# Patient Record
Sex: Male | Born: 2000 | Race: Black or African American | Hispanic: No | Marital: Single | Smoking: Never smoker
Health system: Southern US, Community
[De-identification: ages and names within clinical notes are randomized; demographics above are authoritative.]

## PROBLEM LIST (undated history)

## (undated) DIAGNOSIS — D573 Sickle-cell trait: Secondary | ICD-10-CM

---
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
## 2020-11-09 ENCOUNTER — Other Ambulatory Visit: Payer: Self-pay

## 2020-11-09 ENCOUNTER — Ambulatory Visit (INDEPENDENT_AMBULATORY_CARE_PROVIDER_SITE_OTHER): Payer: Self-pay

## 2020-11-09 ENCOUNTER — Ambulatory Visit (HOSPITAL_COMMUNITY)
Admission: EM | Admit: 2020-11-09 | Discharge: 2020-11-09 | Disposition: A | Discharge status: Home / Self Care | Payer: Self-pay | Attending: Medical Oncology | Admitting: Medical Oncology

## 2020-11-09 ENCOUNTER — Encounter (HOSPITAL_COMMUNITY): Payer: Self-pay | Admitting: Emergency Medicine

## 2020-11-09 DIAGNOSIS — R0602 Shortness of breath: Secondary | ICD-10-CM

## 2020-11-09 DIAGNOSIS — J4521 Mild intermittent asthma with (acute) exacerbation: Secondary | ICD-10-CM

## 2020-11-09 DIAGNOSIS — R042 Hemoptysis: Secondary | ICD-10-CM

## 2020-11-09 MED ORDER — METHYLPREDNISOLONE SODIUM SUCC 125 MG IJ SOLR
125.0000 mg | Freq: Once | INTRAMUSCULAR | Status: AC
  Administered 2020-11-09: 125 mg via INTRAMUSCULAR

## 2020-11-09 MED ORDER — PREDNISONE 10 MG (21) PO TBPK: ORAL_TABLET | Freq: Every day | ORAL | 0 refills | Status: AC

## 2020-11-09 MED ORDER — METHYLPREDNISOLONE SODIUM SUCC 125 MG IJ SOLR
INTRAMUSCULAR | Status: AC
  Filled 2020-11-09: qty 2

## 2020-11-09 MED ORDER — BENZONATATE 100 MG PO CAPS: 100.0000 mg | ORAL_CAPSULE | Freq: Three times a day (TID) | ORAL | 0 refills | Status: AC

## 2020-11-09 MED ORDER — ALBUTEROL SULFATE HFA 108 (90 BASE) MCG/ACT IN AERS
2.0000 | INHALATION_SPRAY | Freq: Once | RESPIRATORY_TRACT | Status: AC
  Administered 2020-11-09: 2 via RESPIRATORY_TRACT

## 2020-11-09 MED ORDER — FLUTICASONE PROPIONATE 50 MCG/ACT NA SUSP: 2.0000 | Freq: Every day | NASAL | 0 refills | Status: AC

## 2020-11-09 MED ORDER — ALBUTEROL SULFATE HFA 108 (90 BASE) MCG/ACT IN AERS
INHALATION_SPRAY | RESPIRATORY_TRACT | Status: AC
  Filled 2020-11-09: qty 6.7

## 2020-11-09 NOTE — ED Triage Notes (Signed)
Cough for a week, , runny nose, muscle aches, stuffy nose, and soreness around lower ribs.

## 2020-11-09 NOTE — ED Notes (Signed)
Patient was evaluated by sarah, pa. prior to leaving the building

## 2020-11-09 NOTE — ED Provider Notes (Signed)
[facility]    CSN: [id number] Arrival date & time: 11/09/20  1315      History   Chief Complaint Chief Complaint  Patient presents with   Cough    HPI Nathan Clements is a 20 y.o. male.   HPI  Cough: Patient reports that he has had a cough that is mostly dry but he does have episodes of yellow and red sputum along with nasal congestion, fatigue, shortness of breath and pleuritic chest pain in the back.  He has been having cold sweats and chills but notes no known fevers though suspected.  He has not tried anything for symptoms as he does not have an inhaler at home.   Past Medical History:  Diagnosis Date   Asthma    Sickle cell trait (HCC)     There are no problems to display for this patient.   History reviewed. No pertinent surgical history.   Home Medications    Prior to Admission medications   Medication Sig Start Date End Date Taking? Authorizing Provider  albuterol (PROVENTIL HFA;VENTOLIN HFA) 108 (90 BASE) MCG/ACT inhaler Inhale 1-2 puffs into the lungs every 6 (six) hours as needed for wheezing. Patient not taking: Reported on 11/09/2020 03/06/12   Marlon Pel, PA-C  aspirin 325 MG tablet Take 325 mg by mouth daily. Pain Patient not taking: Reported on 11/09/2020    [provider]    Family History Family History  Problem Relation Age of Onset   Hypertension Mother     Social History Social History   Tobacco Use   Smoking status: Never   Smokeless tobacco: Never  Vaping Use   Vaping Use: Never used  Substance Use Topics   Alcohol use: Not Currently    Comment: occ   Drug use: Yes    Types: Marijuana     Allergies   Penicillins   Review of Systems Review of Systems  As stated above in HPI Physical Exam Triage Vital Signs ED Triage Vitals  Enc Vitals Group     BP 11/09/20 1446 131/81     Pulse Rate 11/09/20 1446 92     Resp 11/09/20 1446 20     Temp 11/09/20 1446 98.7 F (37.1 C)     Temp Source  11/09/20 1446 Oral     SpO2 11/09/20 1446 95 %     Weight --      Height --      Head Circumference --      Peak Flow --      Pain Score 11/09/20 1443 8     Pain Loc --      Pain Edu? --      Excl. in GC? --    No data found.  Updated Vital Signs BP 131/81 (BP Location: Left Arm)   Pulse 92   Temp 98.7 F (37.1 C) (Oral)   Resp 20   SpO2 95%   Physical Exam Vitals and nursing note reviewed.  Constitutional:      General: He is not in acute distress.    Appearance: Normal appearance. He is not ill-appearing, toxic-appearing or diaphoretic.  HENT:     Head: Normocephalic and atraumatic.     Right Ear: Tympanic membrane normal.     Left Ear: Tympanic membrane normal.     Nose: Congestion and rhinorrhea present.     Mouth/Throat:     Mouth: Mucous membranes are moist.     Pharynx: No oropharyngeal exudate or posterior  oropharyngeal erythema.  Eyes:     Extraocular Movements: Extraocular movements intact.     Pupils: Pupils are equal, round, and reactive to light.  Cardiovascular:     Rate and Rhythm: Normal rate and regular rhythm.     Heart sounds: Normal heart sounds.  Pulmonary:     Breath sounds: Wheezing present.  Musculoskeletal:     Cervical back: Normal range of motion and neck supple.  Lymphadenopathy:     Cervical: Cervical adenopathy present.  Skin:    General: Skin is warm.  Neurological:     Mental Status: He is alert and oriented to person, place, and time.     UC Treatments / Results  Labs (all labs ordered are listed, but only abnormal results are displayed) Labs Reviewed - No data to display  EKG   Radiology No results found.  Procedures Procedures (including critical care time)  Medications Ordered in UC Medications - No data to display  Initial Impression / Assessment and Plan / UC Course  I have reviewed the triage vital signs and the nursing notes.  Pertinent labs & imaging results that were available during my care of the  patient were reviewed by me and considered in my medical decision making (see chart for details).     New. Appears to be an asthma exacerbation. Treating as such while we obtain a chest x ray given his hemoptysis with posterior lung pain to ensure no sign of pneumonia. Treating with prednisone, albuterol, tessalon, flonase. Discussed red flag signs and symptoms. Follow up PRN.  Final Clinical Impressions(s) / UC Diagnoses   Final diagnoses:  None   Discharge Instructions   None    ED Prescriptions   None    PDMP not reviewed this encounter.   Hughie Closs, [place] 11/09/20 1606

## 2020-11-09 NOTE — ED Notes (Signed)
When initially discharged, patient was ambulating to door, stopped and put his head down on desk saying he did not feel well.  Instructed and assisted patient to sit on floor.  Wheelchair was obtained and patient was assisted by this nurse and peer to the wheelchair.  Placed in treatment room flat on exam table.  Rechecked vital signs/documented.  Provided ice packs to cool down and provided cola.  Patient has not eaten today.  After cola, lying flat, patient gradually advanced position and activity level.  Patient has been escorted to the car by wheelchair .

## 2022-06-16 IMAGING — DX DG CHEST 2V
2 series · 2 of 2 positions shown · non-contrast
Comparison: 03/06/2012

CLINICAL DATA: Hemoptysis. Shortness of breath. Asthma. Sickle cell
disease.

EXAM:
CHEST - 2 VIEW

[chest pa]
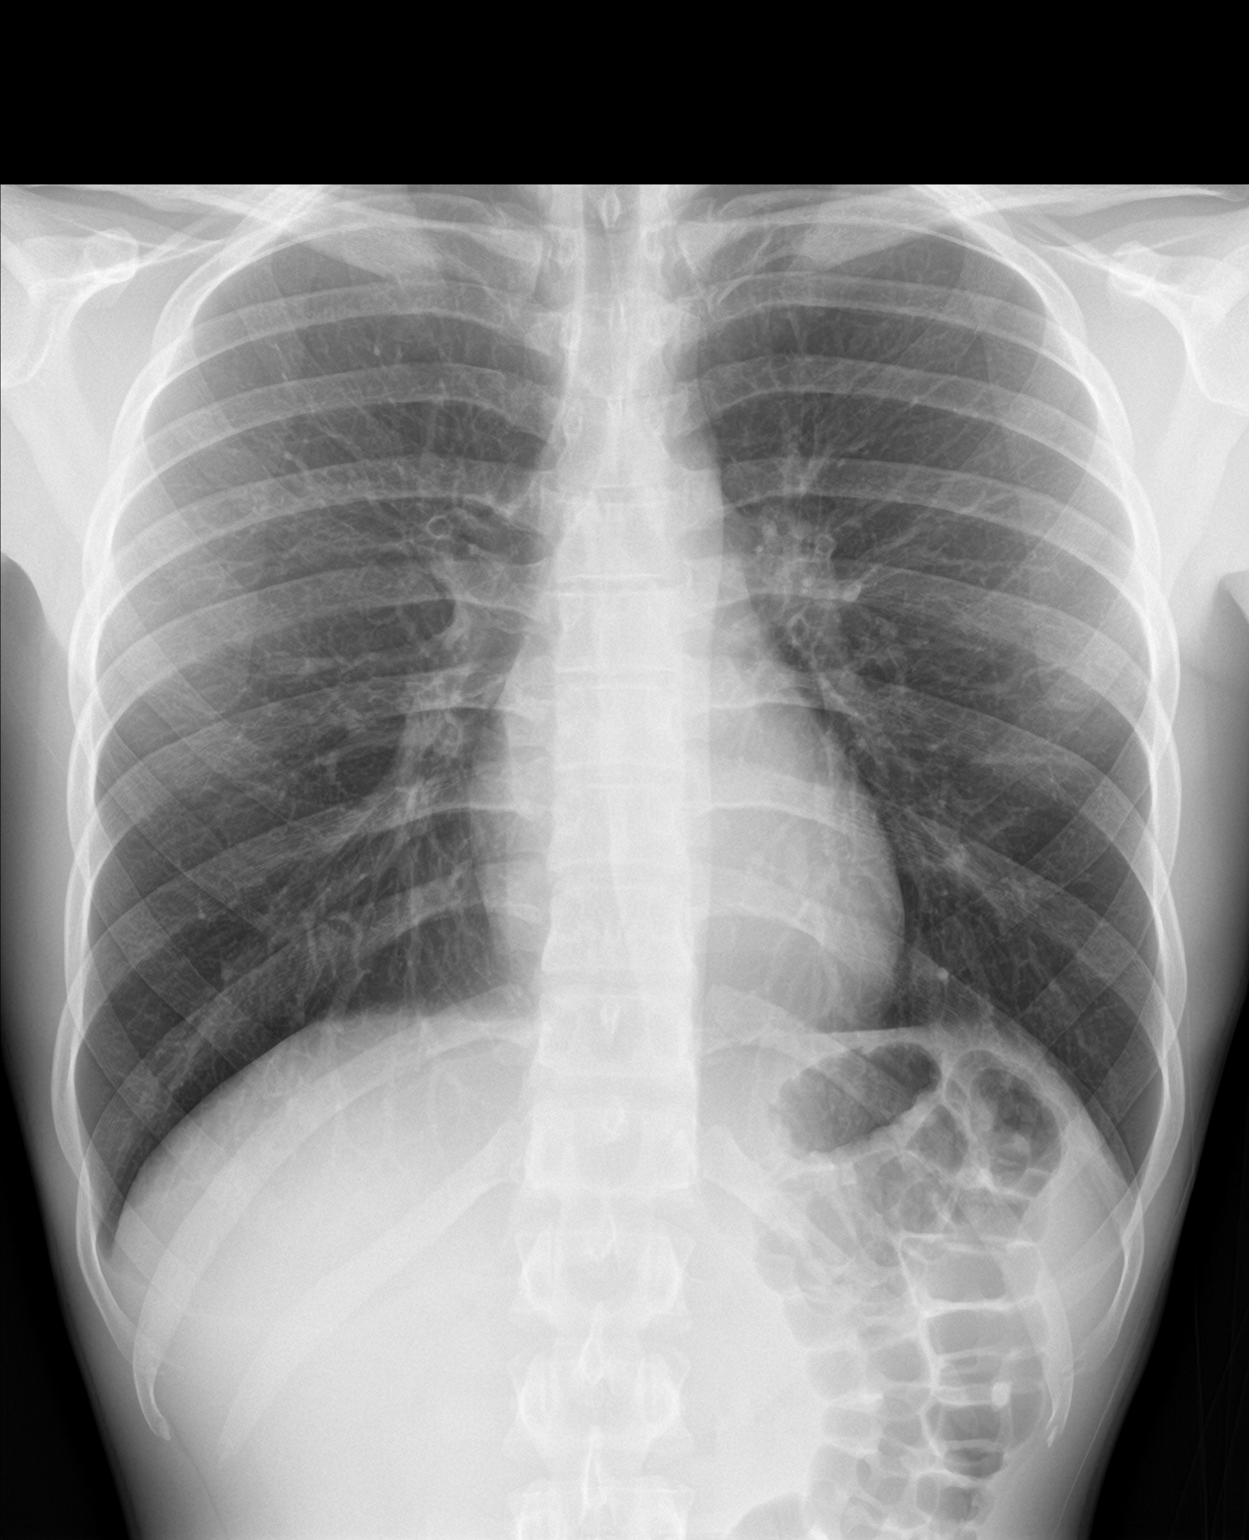

[chest lat]
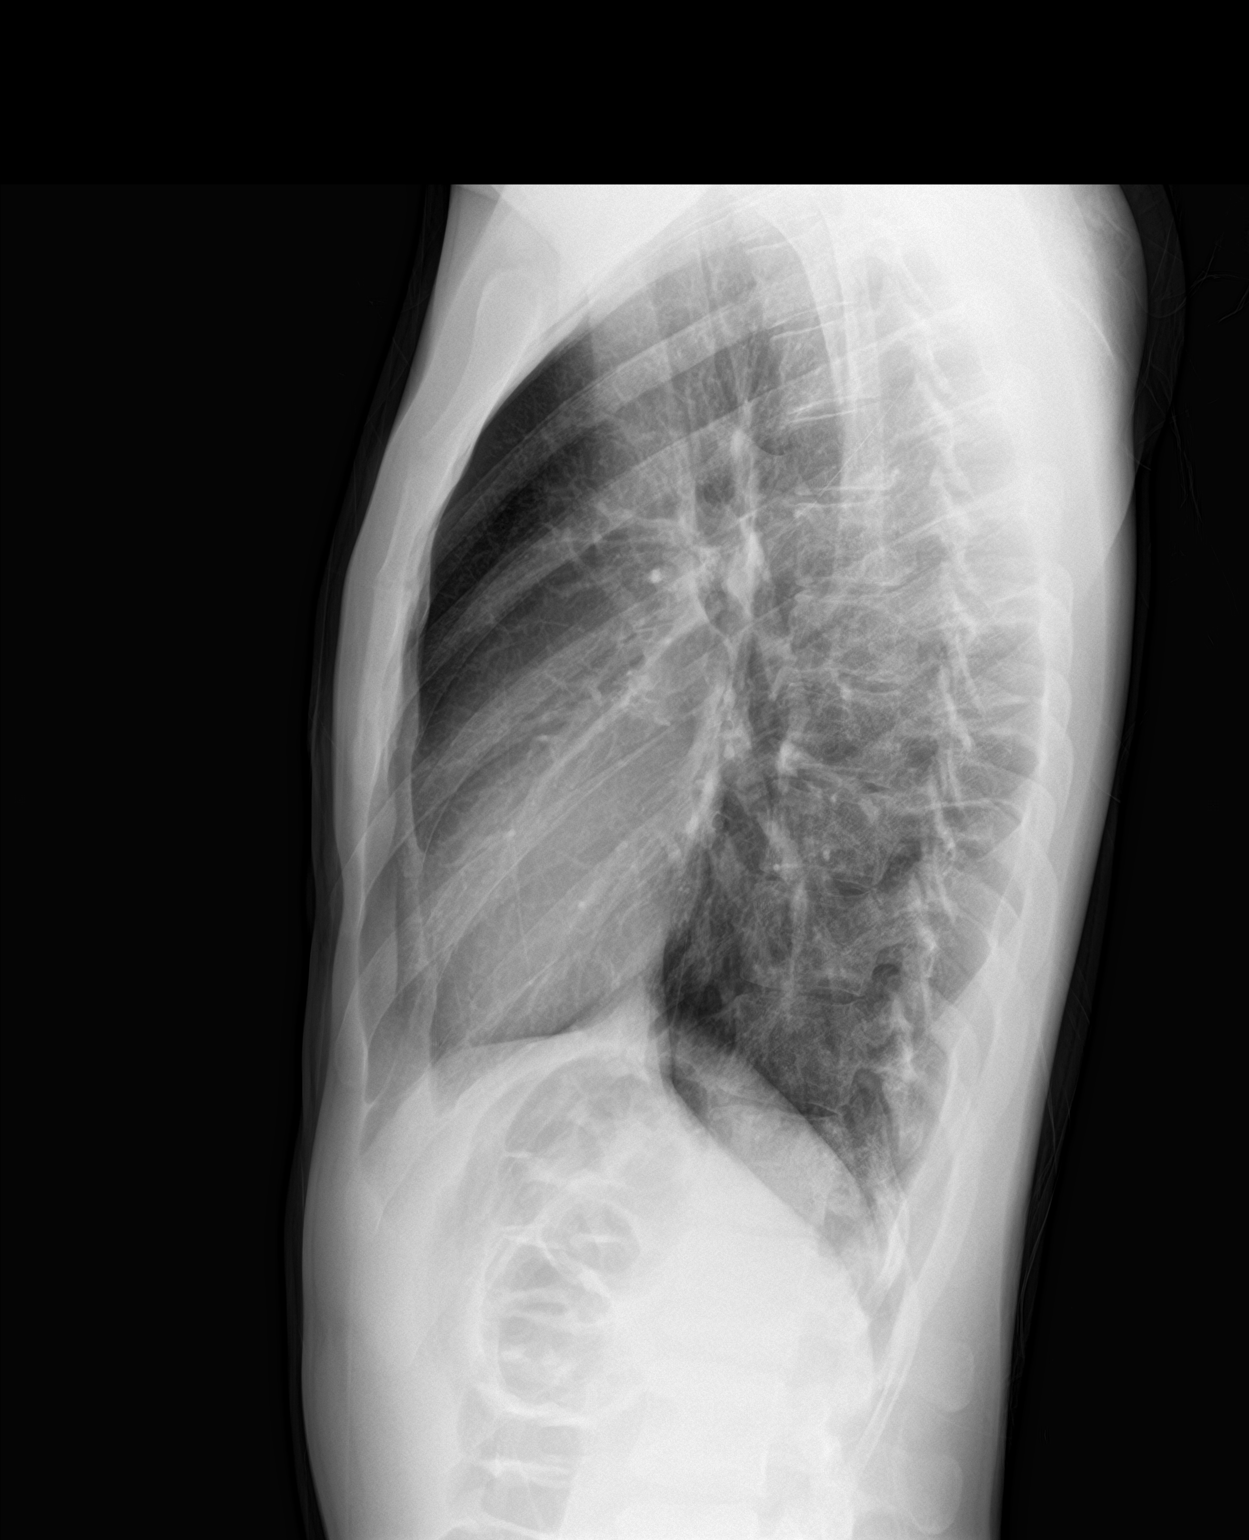

[2 of 2 positions shown; findings below may reference images not displayed]

FINDINGS: Heart size is normal. Mediastinal shadows are normal. The lungs are
clear. No bronchial thickening. No infiltrate, mass, effusion or
collapse. Pulmonary vascularity is normal. No bony abnormality.
IMPRESSION: Normal chest
